# Patient Record
Sex: Female | Born: 1985 | Race: White | Hispanic: No | Marital: Married | Smoking: Current every day smoker
Health system: Southern US, Community
[De-identification: ages and names within clinical notes are randomized; demographics above are authoritative.]

## PROBLEM LIST (undated history)

## (undated) DIAGNOSIS — G43909 Migraine, unspecified, not intractable, without status migrainosus: Secondary | ICD-10-CM

## (undated) DIAGNOSIS — I10 Essential (primary) hypertension: Secondary | ICD-10-CM

## (undated) DIAGNOSIS — F419 Anxiety disorder, unspecified: Secondary | ICD-10-CM

## (undated) DIAGNOSIS — F32A Depression, unspecified: Secondary | ICD-10-CM

## (undated) HISTORY — PX: ABDOMINAL HYSTERECTOMY: SHX81

---

## 2007-04-09 ENCOUNTER — Emergency Department: Payer: Self-pay | Admitting: Emergency Medicine

## 2007-04-19 ENCOUNTER — Emergency Department: Payer: Self-pay | Admitting: Unknown Physician Specialty

## 2007-05-22 ENCOUNTER — Emergency Department: Payer: Self-pay | Admitting: Emergency Medicine

## 2007-09-10 ENCOUNTER — Ambulatory Visit: Payer: Self-pay | Admitting: Family Medicine

## 2008-03-22 ENCOUNTER — Emergency Department: Payer: Self-pay | Admitting: Emergency Medicine

## 2009-07-03 IMAGING — CR DG LUMBAR SPINE COMPLETE 4+V
1 series · 5 of 5 positions shown · non-contrast
Comparison: none

REASON FOR EXAM: pain
COMMENTS:

PROCEDURE:     MDR - M[REDACTED] SPINE WITH OBLIQUES  - September 10, 2007  [DATE]
RESULT:     The lumbar vertebral bodies are preserved in height. The
intervertebral disc space heights are reasonably well maintained. The
posterior elements appear intact. I do not see evidence of a pars defect.

[Series 1: view not recorded · 0.17mm/px · 5 of 5 slices shown]
[im 1/5]
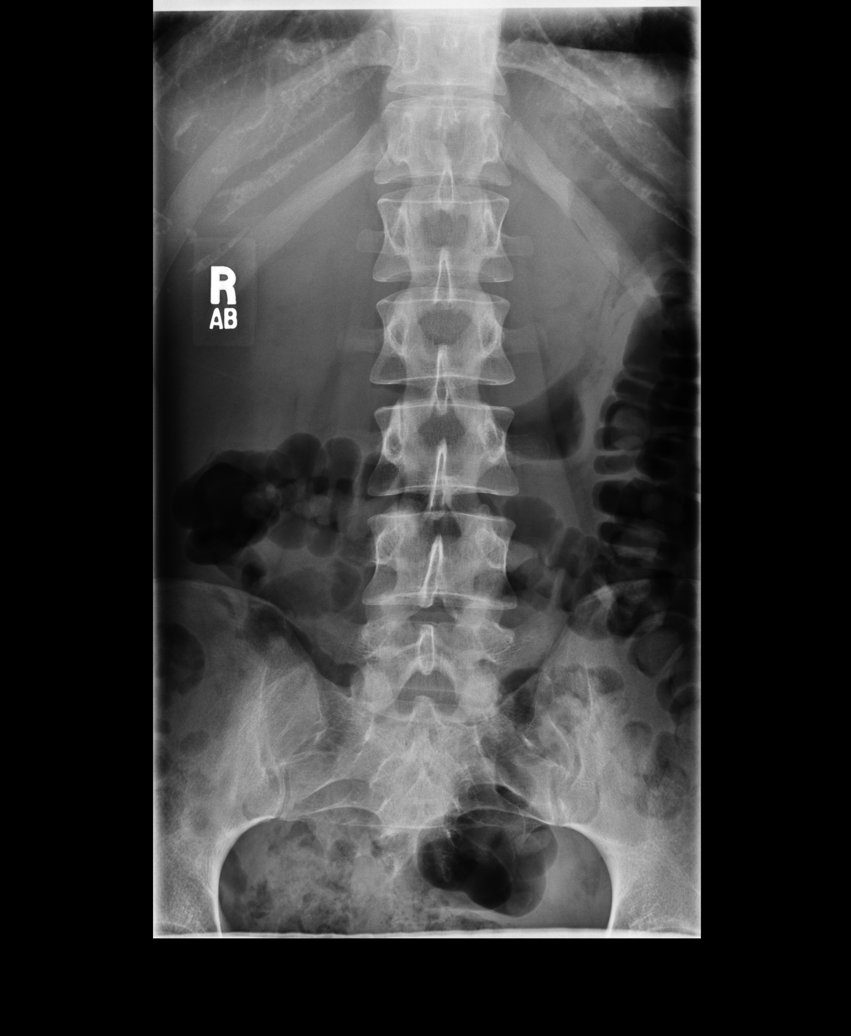
[im 2/5]
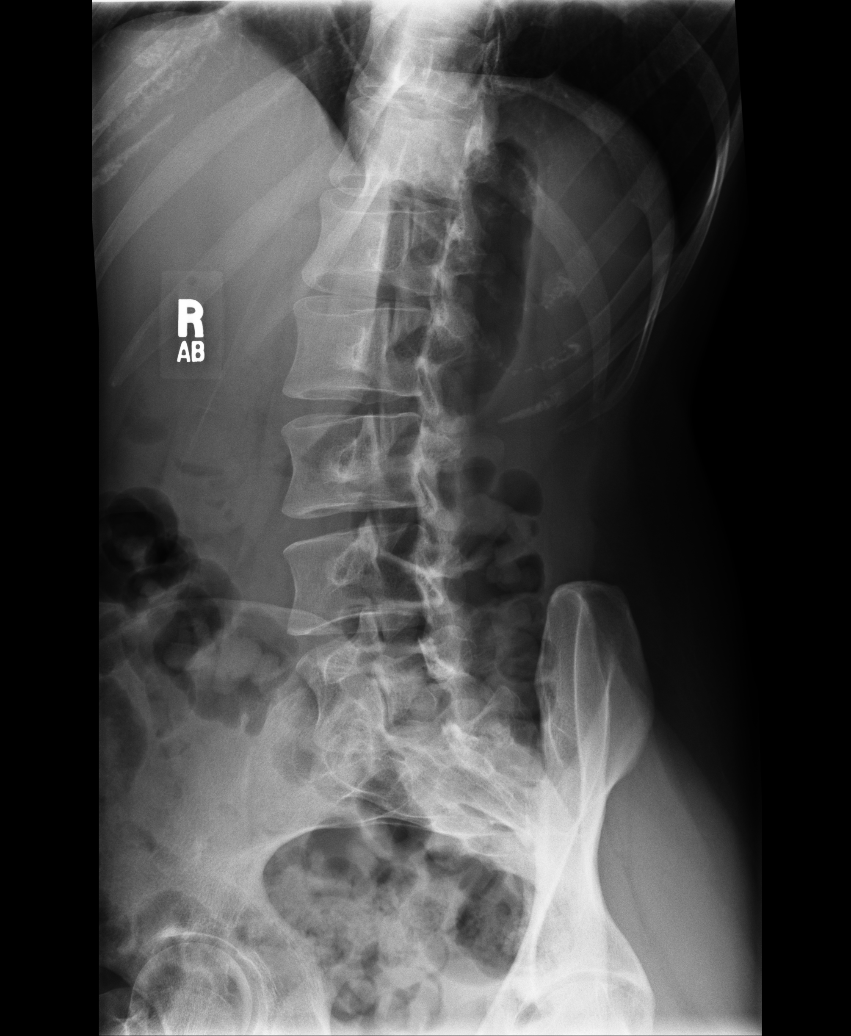
[im 3/5]
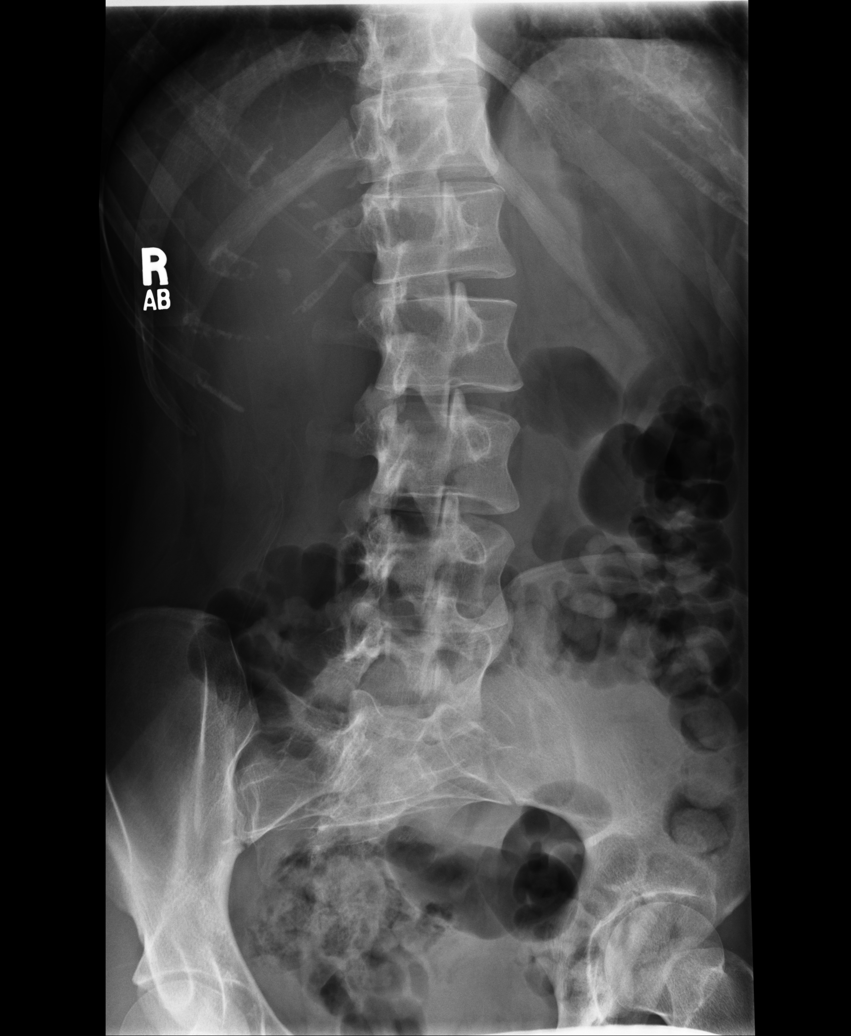
[im 4/5]
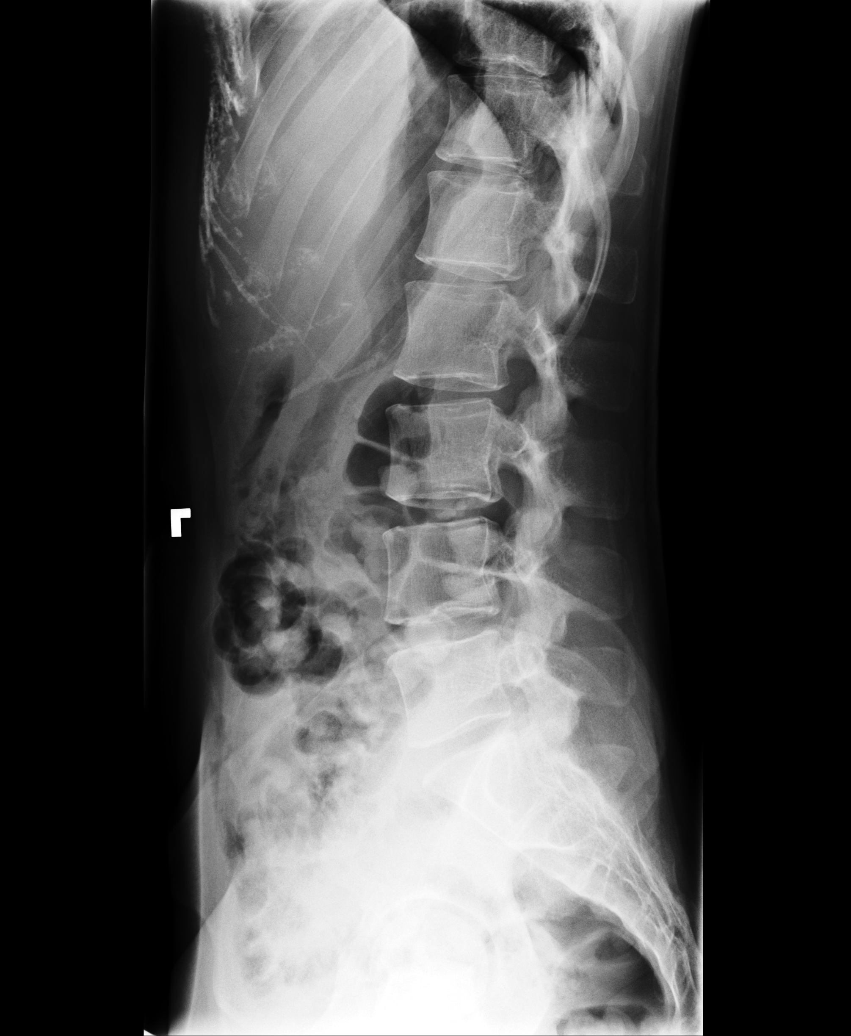
[im 5/5]
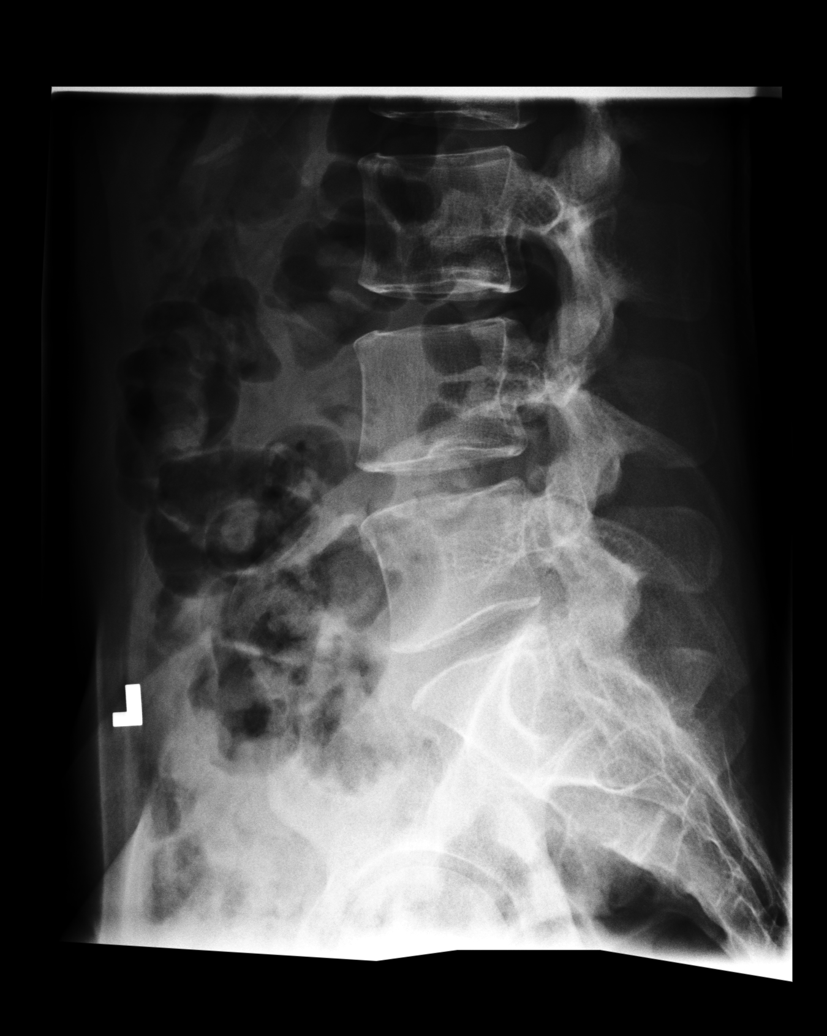

[5 of 5 positions shown; findings below may reference images not displayed]

IMPRESSION: I see no acute bony abnormality of the lumbar spine. If the
patient's symptoms persist and remain unexplained, further evaluation with
MRI may be useful.

## 2018-10-26 ENCOUNTER — Emergency Department (HOSPITAL_BASED_OUTPATIENT_CLINIC_OR_DEPARTMENT_OTHER): Payer: Self-pay

## 2018-10-26 ENCOUNTER — Emergency Department (INDEPENDENT_AMBULATORY_CARE_PROVIDER_SITE_OTHER): Payer: BC Managed Care – PPO

## 2018-10-26 ENCOUNTER — Other Ambulatory Visit: Payer: Self-pay

## 2018-10-26 ENCOUNTER — Emergency Department
Admission: EM | Admit: 2018-10-26 | Discharge: 2018-10-26 | Disposition: A | Discharge status: Home / Self Care | Payer: Self-pay | Source: Home / Self Care

## 2018-10-26 ENCOUNTER — Encounter: Payer: Self-pay | Admitting: Emergency Medicine

## 2018-10-26 ENCOUNTER — Emergency Department: Payer: BC Managed Care – PPO

## 2018-10-26 DIAGNOSIS — W19XXXA Unspecified fall, initial encounter: Secondary | ICD-10-CM

## 2018-10-26 DIAGNOSIS — M25571 Pain in right ankle and joints of right foot: Secondary | ICD-10-CM

## 2018-10-26 DIAGNOSIS — S93401A Sprain of unspecified ligament of right ankle, initial encounter: Secondary | ICD-10-CM | POA: Diagnosis not present

## 2018-10-26 DIAGNOSIS — S99911A Unspecified injury of right ankle, initial encounter: Secondary | ICD-10-CM

## 2018-10-26 HISTORY — DX: Essential (primary) hypertension: I10

## 2018-10-26 HISTORY — DX: Migraine, unspecified, not intractable, without status migrainosus: G43.909

## 2018-10-26 NOTE — ED Provider Notes (Signed)
[facility]    CSN: [id number] Arrival date & time: 10/26/18  1149     History   Chief Complaint Chief Complaint  Patient presents with  . Ankle Injury    HPI Briana Jimenez is a 33 y.o. female.   HPI [name] is a 33 y.o. female presenting to UC with c/o continued Right ankle pain after rolling it on 10/11/2018. She has been taking Tylenol, ibuprofen, applying ice, wrapping and elevating her ankle but pain continues to be about 8/54 with certain movements, especially with ambulation.  Pain and swelling worsen if she is on her feet for prolonged periods of time. Last dose of Tylenol was 500mg  at 6AM.  Hx of surgery to remove bone spur on Right foot/ankle about 20 years ago. No other issues with Right ankle or foot.    Past Medical History:  Diagnosis Date  . Hypertension   . Migraine     There are no active problems to display for this patient.   Past Surgical History:  Procedure Laterality Date  . ABDOMINAL HYSTERECTOMY      OB History   No obstetric history on file.      Home Medications    Prior to Admission medications   Medication Sig Start Date End Date Taking? Authorizing Provider  Galcanezumab-gnlm [facility]) 120 MG/ML SOAJ Inject into the skin every 30 (thirty) days.   Yes [provider]  verapamil (CALAN) 120 MG tablet Take 120 mg by mouth 2 (two) times daily at 10 AM and 5 PM.   Yes [provider]  zolpidem (AMBIEN) 5 MG tablet Take 5 mg by mouth at bedtime as needed for sleep.   Yes [provider]    Family History No family history on file.  Social History Social History   Tobacco Use  . Smoking status: Not on file  Substance Use Topics  . Alcohol use: Not on file  . Drug use: Not on file     Allergies   Codeine and Sulfa antibiotics   Review of Systems Review of Systems  Musculoskeletal: Positive for arthralgias, joint swelling and myalgias. Negative for gait problem.   Skin: Negative for color change and wound.  Neurological: Negative for weakness and numbness.     Physical Exam Triage Vital Signs ED Triage Vitals  Enc Vitals Group     BP 10/26/18 1208 128/84     Pulse Rate 10/26/18 1208 (!) 102     Resp 10/26/18 1208 16     Temp 10/26/18 1208 98.4 F (36.9 C)     Temp Source 10/26/18 1208 Oral     SpO2 10/26/18 1208 98 %     Weight 10/26/18 1209 165 lb (74.8 kg)     Height 10/26/18 1209 5\' 10"  (1.778 m)     Head Circumference --      Peak Flow --      Pain Score 10/26/18 1209 3     Pain Loc --      Pain Edu? --      Excl. in [place]? --    No data found.  Updated Vital Signs BP 128/84 (BP Location: Right Arm)   Pulse (!) 102   Temp 98.4 F (36.9 C) (Oral)   Resp 16   Ht 5\' 10"  (1.778 m)   Wt 165 lb (74.8 kg)   SpO2 98%   BMI 23.68 kg/m   Visual Acuity Right Eye Distance:   Left Eye Distance:  Bilateral Distance:    Right Eye Near:   Left Eye Near:    Bilateral Near:     Physical Exam Vitals signs and nursing note reviewed.  Constitutional:      Appearance: Normal appearance. She is well-developed.  HENT:     Head: Normocephalic and atraumatic.  Neck:     Musculoskeletal: Normal range of motion.  Cardiovascular:     Rate and Rhythm: Normal rate and regular rhythm.     Pulses:          Dorsalis pedis pulses are 2+ on the right side.       Posterior tibial pulses are 2+ on the right side.  Pulmonary:     Effort: Pulmonary effort is normal.  Musculoskeletal: Normal range of motion.        General: Tenderness present. No swelling.     Comments: Right ankle and foot: no edema, full ROM, mild tenderness to medial, lateral and posterior aspect of ankle. No calf tenderness.  Skin:    General: Skin is warm and dry.     Capillary Refill: Capillary refill takes less than 2 seconds.     Findings: No bruising or erythema.  Neurological:     Mental Status: She is alert and oriented to person, place, and time.  Psychiatric:         Behavior: Behavior normal.      UC Treatments / Results  Labs (all labs ordered are listed, but only abnormal results are displayed) Labs Reviewed - No data to display  EKG   Radiology Dg Ankle Complete Right  Result Date: 10/26/2018 CLINICAL DATA:  Fall. Right ankle injury and pain. Initial encounter. EXAM: RIGHT ANKLE - COMPLETE 3+ VIEW COMPARISON:  None. FINDINGS: There is no evidence of fracture, dislocation, or joint effusion. There is no evidence of arthropathy or other focal bone abnormality. Soft tissues are unremarkable. IMPRESSION: Negative. Electronically Signed   By: Danae OrleansJohn A Stahl M.D.   On: 10/26/2018 13:04    Procedures Procedures (including critical care time)  Medications Ordered in UC Medications - No data to display  Initial Impression / Assessment and Plan / UC Course  I have reviewed the triage vital signs and the nursing notes.  Pertinent labs & imaging results that were available during my care of the patient were reviewed by me and considered in my medical decision making (see chart for details).    Reviewed imaging with pt Will tx as mild sprain given continued pain despite conservative tx at home. ASO ankle splint provided for comfort. AVS provided.  Final Clinical Impressions(s) / UC Diagnoses   Final diagnoses:  Right ankle injury, initial encounter  Mild ankle sprain, right, initial encounter     Discharge Instructions      You may continue to take Tylenol and ibuprofen as needed for pain, elevate and ice to help with swelling, especially after a long day of walking.  You may use the ankle splint as comfort. It is recommended you try the home ankle exercises in this packet.  Please call to schedule a follow up appointment with Sports Medicine in 1-2 weeks if not improving.     ED Prescriptions    None     Controlled Substance Prescriptions [place] Controlled Substance Registry consulted? Not Applicable   Rolla Platehelps, Mandisa Persinger O, PA-C  10/26/18 1327

## 2018-10-26 NOTE — Discharge Instructions (Signed)
°  You may continue to take Tylenol and ibuprofen as needed for pain, elevate and ice to help with swelling, especially after a long day of walking.  You may use the ankle splint as comfort. It is recommended you try the home ankle exercises in this packet.  Please call to schedule a follow up appointment with Sports Medicine in 1-2 weeks if not improving.

## 2018-10-26 NOTE — ED Triage Notes (Signed)
Patient rolled her right ankle 10/11/18; she can walk on it but by end of day edematous and painful. Took tylenol 500mg  po at 0600. She has not travelled past 4 weeks.

## 2020-11-17 ENCOUNTER — Other Ambulatory Visit: Payer: Self-pay

## 2020-11-17 ENCOUNTER — Emergency Department
Admission: EM | Admit: 2020-11-17 | Discharge: 2020-11-17 | Disposition: A | Discharge status: Home / Self Care | Payer: BC Managed Care – PPO | Source: Home / Self Care

## 2020-11-17 DIAGNOSIS — U071 COVID-19: Secondary | ICD-10-CM

## 2020-11-17 DIAGNOSIS — J029 Acute pharyngitis, unspecified: Secondary | ICD-10-CM

## 2020-11-17 MED ORDER — NIRMATRELVIR/RITONAVIR (PAXLOVID)TABLET: 3.0000 | ORAL_TABLET | Freq: Two times a day (BID) | ORAL | 0 refills | Status: AC

## 2020-11-17 MED ORDER — PREDNISONE 20 MG PO TABS: ORAL_TABLET | ORAL | 0 refills | Status: AC

## 2020-11-17 NOTE — Discharge Instructions (Addendum)
Advised/instructed patient to take medications as directed with food to completion.  Encouraged patient increase daily water intake while taking these medications.  Advised/encouraged patient to self quarantine until 11/25/2020.  Advised if symptoms have completely resolved may self quarantine until 11/20/2020.

## 2020-11-17 NOTE — ED Triage Notes (Signed)
Pt c/o sore throat, congestion and bilateral ear pain. COVID at home positive on Monday.  Fever tmax 100.3 Monday evening. Tylenol 7am this morning. Also taking sudafed and cold and flu prn.

## 2020-11-17 NOTE — ED Provider Notes (Signed)
[facility]    CSN: [id number] Arrival date & time: 11/17/20  0943      History   Chief Complaint Chief Complaint  Patient presents with   Covid Positive   Sore Throat   Nasal Congestion    W/ ear pain    HPI Briana Jimenez is a 35 y.o. female.   HPI 35 year old female presents with positive COVID-19 home test on Monday, 11/14/2020 with temp max of 100.3 Monday evening.  Patient complains of sore throat, sinus nasal congestion and bilateral ear pain for the past 4 days.  Past Medical History:  Diagnosis Date   Hypertension    Migraine     There are no problems to display for this patient.   Past Surgical History:  Procedure Laterality Date   ABDOMINAL HYSTERECTOMY      OB History   No obstetric history on file.      Home Medications    Prior to Admission medications   Medication Sig Start Date End Date Taking? Authorizing Provider  atorvastatin (LIPITOR) 10 MG tablet Take by mouth. 06/09/20  Yes [provider]  busPIRone (BUSPAR) 15 MG tablet Take by mouth. 03/12/18  Yes [provider]  cariprazine (VRAYLAR) 1.5 MG capsule Take by mouth. 11/21/19  Yes [provider]  cyclobenzaprine (FLEXERIL) 5 MG tablet TAKE 1 TABLET BY MOUTH UP TO 3 TIMES PER DAY AS NEEDED FOR SPASMS 12/28/19  Yes [provider]  nirmatrelvir/ritonavir EUA (PAXLOVID) TABS Take 3 tablets by mouth 2 (two) times daily for 5 days. Patient GFR is 101. Take nirmatrelvir (150 mg) two tablets twice daily for 5 days and ritonavir (100 mg) one tablet twice daily for 5 days. 11/17/20 11/22/20 Yes Trevor Iha, FNP  norethindrone-ethinyl estradiol (LOESTRIN) 1-20 MG-MCG tablet Take by mouth. 06/02/19  Yes [provider]  predniSONE (DELTASONE) 20 MG tablet Take 3 tabs PO daily x 5 days. 11/17/20  Yes Trevor Iha, FNP  QUEtiapine (SEROQUEL) 25 MG tablet Take 1-2 tablets by mouth at bedtime. 12/29/19  Yes [provider]  atenolol  (TENORMIN) 25 MG tablet atenolol 25 mg tablet    [provider]  Galcanezumab-gnlm (EMGALITY) 120 MG/ML SOAJ Inject into the skin every 30 (thirty) days.    [provider]  traMADol (ULTRAM) 50 MG tablet Take by mouth.    [provider]  verapamil (CALAN) 120 MG tablet Take 120 mg by mouth 2 (two) times daily at 10 AM and 5 PM.    [provider]  zolpidem (AMBIEN) 5 MG tablet Take 5 mg by mouth at bedtime as needed for sleep.    [provider]    Family History History reviewed. No pertinent family history.  Social History Social History   Tobacco Use   Smoking status: Every Day    Packs/day: 0.50    Types: Cigarettes   Smokeless tobacco: Never  Vaping Use   Vaping Use: Never used     Allergies   Codeine, Augmentin [amoxicillin-pot clavulanate], Cefdinir, and Sulfa antibiotics   Review of Systems Review of Systems  Constitutional:  Positive for fatigue and fever.  HENT:  Positive for congestion, ear pain and sore throat.     Physical Exam Triage Vital Signs ED Triage Vitals  Enc Vitals Group     BP --      Pulse Rate 11/17/20 1002 (!) 126     Resp 11/17/20 1002 18     Temp 11/17/20 1002 99 F (37.2  C)     Temp Source 11/17/20 1002 Oral     SpO2 11/17/20 1002 98 %     Weight --      Height --      Head Circumference --      Peak Flow --      Pain Score 11/17/20 1003 3     Pain Loc --      Pain Edu? --      Excl. in [place]? --    No data found.  Updated Vital Signs BP (!) 138/100 (BP Location: Right Arm)   Pulse (!) 126   Temp 99 F (37.2 C) (Oral)   Resp 18   SpO2 98%      Physical Exam Vitals and nursing note reviewed.  Constitutional:      General: She is not in acute distress.    Appearance: Normal appearance. She is normal weight. She is ill-appearing.  HENT:     Head: Normocephalic and atraumatic.     Right Ear: Tympanic membrane, ear canal and external ear normal.     Left Ear: Tympanic  membrane, ear canal and external ear normal.     Nose: Nose normal.     Mouth/Throat:     Mouth: Mucous membranes are moist.     Pharynx: Oropharynx is clear. Posterior oropharyngeal erythema present.  Eyes:     Extraocular Movements: Extraocular movements intact.     Conjunctiva/sclera: Conjunctivae normal.     Pupils: Pupils are equal, round, and reactive to light.  Cardiovascular:     Rate and Rhythm: Normal rate and regular rhythm.     Pulses: Normal pulses.     Heart sounds: Normal heart sounds.  Pulmonary:     Effort: Pulmonary effort is normal. No respiratory distress.     Breath sounds: Normal breath sounds. No stridor. No wheezing, rhonchi or rales.  Chest:     Chest wall: No tenderness.  Musculoskeletal:        General: Normal range of motion.     Cervical back: Normal range of motion and neck supple. Tenderness present.  Lymphadenopathy:     Cervical: Cervical adenopathy present.  Skin:    General: Skin is warm and dry.  Neurological:     General: No focal deficit present.     Mental Status: She is alert and oriented to person, place, and time.  Psychiatric:        Mood and Affect: Mood normal.        Behavior: Behavior normal.        Thought Content: Thought content normal.     UC Treatments / Results  Labs (all labs ordered are listed, but only abnormal results are displayed) Labs Reviewed - No data to display  EKG   Radiology No results found.  Procedures Procedures (including critical care time)  Medications Ordered in UC Medications - No data to display  Initial Impression / Assessment and Plan / UC Course  I have reviewed the triage vital signs and the nursing notes.  Pertinent labs & imaging results that were available during my care of the patient were reviewed by me and considered in my medical decision making (see chart for details).     MDM: 1.  Acute pharyngitis-Rx prednisone 60 mg daily x5 days; 2. COVID-19-Rx'd Paxlovid.  Advised/instructed patient to take medications as directed with food to completion.  Encouraged patient increase daily water intake while taking these medications.  Advised/encouraged patient to self quarantine until 11/25/2020.  Advised if symptoms have completely resolved may self quarantine until 11/20/2020.  Discharged home, hemodynamically stable. Final Clinical Impressions(s) / UC Diagnoses   Final diagnoses:  Acute pharyngitis, unspecified etiology  COVID-19     Discharge Instructions      Advised/instructed patient to take medications as directed with food to completion.  Encouraged patient increase daily water intake while taking these medications.  Advised/encouraged patient to self quarantine until 11/25/2020.  Advised if symptoms have completely resolved may self quarantine until 11/20/2020.     ED Prescriptions     Medication Sig Dispense Auth. Provider   predniSONE (DELTASONE) 20 MG tablet Take 3 tabs PO daily x 5 days. 15 tablet Trevor Iha, FNP   nirmatrelvir/ritonavir EUA (PAXLOVID) TABS Take 3 tablets by mouth 2 (two) times daily for 5 days. Patient GFR is 101. Take nirmatrelvir (150 mg) two tablets twice daily for 5 days and ritonavir (100 mg) one tablet twice daily for 5 days. 30 tablet Trevor Iha, FNP      PDMP not reviewed this encounter.   Trevor Iha, FNP 11/17/20 1032

## 2021-06-13 ENCOUNTER — Other Ambulatory Visit (HOSPITAL_COMMUNITY): Payer: Self-pay

## 2021-10-09 ENCOUNTER — Emergency Department
Admission: EM | Admit: 2021-10-09 | Discharge: 2021-10-09 | Disposition: A | Discharge status: Home / Self Care | Payer: BC Managed Care – PPO | Source: Home / Self Care

## 2021-10-09 DIAGNOSIS — R21 Rash and other nonspecific skin eruption: Secondary | ICD-10-CM

## 2021-10-09 DIAGNOSIS — B350 Tinea barbae and tinea capitis: Secondary | ICD-10-CM

## 2021-10-09 HISTORY — DX: Anxiety disorder, unspecified: F41.9

## 2021-10-09 HISTORY — DX: Depression, unspecified: F32.A

## 2021-10-09 MED ORDER — METHYLPREDNISOLONE 4 MG PO TBPK: ORAL_TABLET | ORAL | 0 refills | Status: AC
